# Patient Record
Sex: Male | Born: 1995
Health system: Southern US, Community
[De-identification: ages and names within clinical notes are randomized; demographics above are authoritative.]

## PROBLEM LIST (undated history)

## (undated) DIAGNOSIS — Z21 Asymptomatic human immunodeficiency virus [HIV] infection status: Secondary | ICD-10-CM

## (undated) DIAGNOSIS — A630 Anogenital (venereal) warts: Secondary | ICD-10-CM

## (undated) DIAGNOSIS — B20 Human immunodeficiency virus [HIV] disease: Secondary | ICD-10-CM

## (undated) DIAGNOSIS — D849 Immunodeficiency, unspecified: Secondary | ICD-10-CM

## (undated) HISTORY — PX: OTHER SURGICAL HISTORY: SHX169

---

## 2017-07-18 ENCOUNTER — Emergency Department (HOSPITAL_BASED_OUTPATIENT_CLINIC_OR_DEPARTMENT_OTHER)
Admission: EM | Admit: 2017-07-18 | Discharge: 2017-07-18 | Disposition: A | Discharge status: Home / Self Care | Payer: Self-pay | Attending: Physician Assistant | Admitting: Physician Assistant

## 2017-07-18 ENCOUNTER — Encounter (HOSPITAL_BASED_OUTPATIENT_CLINIC_OR_DEPARTMENT_OTHER): Payer: Self-pay | Admitting: Emergency Medicine

## 2017-07-18 DIAGNOSIS — B20 Human immunodeficiency virus [HIV] disease: Secondary | ICD-10-CM | POA: Insufficient documentation

## 2017-07-18 DIAGNOSIS — Z9889 Other specified postprocedural states: Secondary | ICD-10-CM | POA: Insufficient documentation

## 2017-07-18 DIAGNOSIS — K5903 Drug induced constipation: Secondary | ICD-10-CM | POA: Insufficient documentation

## 2017-07-18 DIAGNOSIS — F17200 Nicotine dependence, unspecified, uncomplicated: Secondary | ICD-10-CM | POA: Insufficient documentation

## 2017-07-18 HISTORY — DX: Anogenital (venereal) warts: A63.0

## 2017-07-18 HISTORY — DX: Human immunodeficiency virus (HIV) disease: B20

## 2017-07-18 HISTORY — DX: Asymptomatic human immunodeficiency virus (hiv) infection status: Z21

## 2017-07-18 HISTORY — DX: Immunodeficiency, unspecified: D84.9

## 2017-07-18 MED ORDER — LIDOCAINE HCL 2 % EX GEL
1.0000 "application " | Freq: Once | CUTANEOUS | Status: AC
  Administered 2017-07-18: 1
  Filled 2017-07-18: qty 20

## 2017-07-18 MED ORDER — CYCLOBENZAPRINE HCL 10 MG PO TABS: 10.0000 mg | ORAL_TABLET | Freq: Two times a day (BID) | ORAL | 0 refills | Status: AC | PRN

## 2017-07-18 MED ORDER — POLYETHYLENE GLYCOL 3350 17 G PO PACK: 17.0000 g | PACK | Freq: Every day | ORAL | 0 refills | Status: AC

## 2017-07-18 NOTE — ED Triage Notes (Signed)
Pt s/p genital wart surgery. States constipation, abd and back pain since. Also reports frank rectal bleeding. Denies dizziness or lightheadedness. Denies fever, nausea or vomiting.

## 2017-07-18 NOTE — ED Notes (Signed)
ED Provider at bedside. Exam completed with RN at bedside.

## 2017-07-18 NOTE — Discharge Instructions (Signed)
Please call your surgeon if you have any increase in bleeding, however your wound appears well healing today. Please use MiraLAX to help keep your stool softening. You can use Flexeril to help with your muscle back strain, it is a muscle relaxant, so it will make you sleepy so please do not drive or drink with it. Please return immediately if you have increased bleeding, or other concerns.

## 2017-07-18 NOTE — ED Provider Notes (Signed)
[facility] Provider [facility]   CSN: [id number] Arrival date & time: 07/18/17  0303     History   Chief Complaint Chief Complaint  Patient presents with  . Abdominal Pain    HPI Clement Audree Camel Traum is a 21 y.o. male.  HPI   Patient is a 21 year old male HIV positive with recent genital wart surgery to his rectum. This is done on Thursday at [facility]. Patient reports since surgery he has been having trouble stooling. He's been taking hydrocodone for the pain. Patient reports that he had some bleeding rectally with stooling. This filled up a 4 x 4. Patient also reports having back pain since the surgery. He talked to his surgeon about it who told him to use ibuprofen to help likely muscle strain. It is worse with movement. No pain at rest. It is located bilaterally in the lower back.  Past Medical History:  Diagnosis Date  . Genital warts   . HIV (human immunodeficiency virus infection) (HCC)   . Immune deficiency disorder (HCC)     There are no active problems to display for this patient.   Past Surgical History:  Procedure Laterality Date  . genital wart removal         Home Medications    Prior to Admission medications   Medication Sig Start Date End Date Taking? Authorizing Provider  docusate sodium (COLACE) 100 MG capsule Take 100 mg by mouth 2 (two) times daily.   Yes [provider]  HYDROcodone-acetaminophen (NORCO/VICODIN) 5-325 MG tablet Take 1 tablet by mouth every 6 (six) hours as needed for moderate pain.   Yes [provider]  cyclobenzaprine (FLEXERIL) 10 MG tablet Take 1 tablet (10 mg total) by mouth 2 (two) times daily as needed for muscle spasms. 07/18/17   Noralyn Karim Lyn, MD  polyethylene glycol (MIRALAX) packet Take 17 g by mouth daily. 07/18/17   Karlena Luebke, Cindee Saltourteney Lyn, MD    Family History No family history on file.  Social History Social History  Substance Use Topics  . Smoking status: Current Every Day  Smoker  . Smokeless tobacco: Never Used  . Alcohol use Yes     Comment: occasional     Allergies   Patient has no allergy information on record.   Review of Systems Review of Systems  Constitutional: Negative for activity change, fatigue and fever.  Respiratory: Negative for shortness of breath.   Cardiovascular: Negative for chest pain.  Gastrointestinal: Positive for anal bleeding. Negative for abdominal pain.  Neurological: Negative for headaches.     Physical Exam Updated Vital Signs BP 134/81 (BP Location: Right Arm)   Pulse 82   Temp 98.5 F (36.9 C) (Oral)   Resp 16   Ht 5\' 11"  (1.803 m)   Wt 63.5 kg (140 lb)   SpO2 100%   BMI 19.53 kg/m   Physical Exam  Constitutional: He is oriented to person, place, and time. He appears well-nourished.  HENT:  Head: Normocephalic.  Eyes: Conjunctivae are normal. Right eye exhibits no discharge. Left eye exhibits no discharge.  Cardiovascular: Normal rate and regular rhythm.   Pulmonary/Chest: Effort normal and breath sounds normal. No respiratory distress.  Genitourinary: Penis normal.  Genitourinary Comments: Rectum with no bleeding.  Musculoskeletal: Normal range of motion.  Neurological: He is oriented to person, place, and time.  Skin: Skin is warm and dry. He is not diaphoretic.  Psychiatric: He has a normal mood and affect. His behavior is normal.  ED Treatments / Results  Labs (all labs ordered are listed, but only abnormal results are displayed) Labs Reviewed - No data to display  EKG  EKG Interpretation None       Radiology No results found.  Procedures Procedures (including critical care time)  Medications Ordered in ED Medications  lidocaine (XYLOCAINE) 2 % jelly 1 application (not administered)     Initial Impression / Assessment and Plan / ED Course  I have reviewed the triage vital signs and the nursing notes.  Pertinent labs & imaging results that were available during my care of  the patient were reviewed by me and considered in my medical decision making (see chart for details).     Patient here after rectal surgery for removal genital warts. Patient's has no active bleeding and he describes very little bleeding.  Patient initially had abdominal pain on arrival. However he had a giant stool here and felt feels much better. We'll have him use MiraLAX in addition to colace to help keep stools soft. He has normal vitals and physical exam. Appears very relaxed and upbeat. Patient has back pain since the surgery, think it is likely musculoskeletal in nature. Will give muscle releaxants to help. Patient has no fever no other concerning symptoms. We'll have him follow-up with the surgeon next week.  Final Clinical Impressions(s) / ED Diagnoses   Final diagnoses:  Drug-induced constipation    New Prescriptions New Prescriptions   CYCLOBENZAPRINE (FLEXERIL) 10 MG TABLET    Take 1 tablet (10 mg total) by mouth 2 (two) times daily as needed for muscle spasms.   POLYETHYLENE GLYCOL (MIRALAX) PACKET    Take 17 g by mouth daily.     Abelino DerrickMackuen, Damarie Schoolfield Lyn, MD 07/18/17 [phone]

## 2019-08-14 ENCOUNTER — Other Ambulatory Visit: Payer: Self-pay

## 2019-08-14 ENCOUNTER — Emergency Department (HOSPITAL_BASED_OUTPATIENT_CLINIC_OR_DEPARTMENT_OTHER)
Admission: EM | Admit: 2019-08-14 | Discharge: 2019-08-14 | Disposition: A | Discharge status: Home / Self Care | Payer: BC Managed Care – PPO | Attending: Emergency Medicine | Admitting: Emergency Medicine

## 2019-08-14 ENCOUNTER — Encounter (HOSPITAL_BASED_OUTPATIENT_CLINIC_OR_DEPARTMENT_OTHER): Payer: Self-pay | Admitting: *Deleted

## 2019-08-14 ENCOUNTER — Emergency Department (HOSPITAL_BASED_OUTPATIENT_CLINIC_OR_DEPARTMENT_OTHER): Payer: BC Managed Care – PPO

## 2019-08-14 DIAGNOSIS — Z79899 Other long term (current) drug therapy: Secondary | ICD-10-CM | POA: Insufficient documentation

## 2019-08-14 DIAGNOSIS — M545 Low back pain, unspecified: Secondary | ICD-10-CM

## 2019-08-14 DIAGNOSIS — R519 Headache, unspecified: Secondary | ICD-10-CM

## 2019-08-14 DIAGNOSIS — R51 Headache: Secondary | ICD-10-CM | POA: Insufficient documentation

## 2019-08-14 DIAGNOSIS — F17200 Nicotine dependence, unspecified, uncomplicated: Secondary | ICD-10-CM | POA: Diagnosis not present

## 2019-08-14 DIAGNOSIS — Z21 Asymptomatic human immunodeficiency virus [HIV] infection status: Secondary | ICD-10-CM | POA: Insufficient documentation

## 2019-08-14 LAB — URINALYSIS, ROUTINE W REFLEX MICROSCOPIC
Bilirubin Urine: NEGATIVE
Glucose, UA: NEGATIVE mg/dL
Hgb urine dipstick: NEGATIVE
Ketones, ur: NEGATIVE mg/dL
Leukocytes,Ua: NEGATIVE
Nitrite: NEGATIVE
Protein, ur: NEGATIVE mg/dL
Specific Gravity, Urine: 1.025 (ref 1.005–1.030)
pH: 6 (ref 5.0–8.0)

## 2019-08-14 MED ORDER — METHOCARBAMOL 500 MG PO TABS: 500.0000 mg | ORAL_TABLET | Freq: Two times a day (BID) | ORAL | 0 refills | Status: AC

## 2019-08-14 MED ORDER — KETOROLAC TROMETHAMINE 60 MG/2ML IM SOLN
60.0000 mg | Freq: Once | INTRAMUSCULAR | Status: AC
  Administered 2019-08-14: 60 mg via INTRAMUSCULAR
  Filled 2019-08-14: qty 2

## 2019-08-14 MED FILL — METHOCARBAMOL 500 MG TABS: 500 | 10 days supply | Qty: 20 | Fill #0

## 2019-08-14 NOTE — Discharge Instructions (Signed)
You can take Tylenol or Ibuprofen as directed for pain. You can alternate Tylenol and Ibuprofen every 4 hours. If you take Tylenol at 1pm, then you can take Ibuprofen at 5pm. Then you can take Tylenol again at 9pm.   Take Robaxin as prescribed. This medication will make you drowsy so do not drive or drink alcohol when taking it.  Follow-up with your primary care doctor.  Return the emergency department any fever, vision changes, nausea/vomiting, numbness/weakness, worsening pain or any other worsening concerning symptoms.

## 2019-08-14 NOTE — ED Provider Notes (Signed)
[facility] EMERGENCY DEPARTMENT Provider Note   CSN: [id number] Arrival date & time: 08/14/19  1053     History   Chief Complaint Chief Complaint  Patient presents with   Back Pain    HPI Ernest Green is a 23 y.o. male past medical history of HIV (last T-helper count of 0.71 in Feb 2020) who presents for evaluation of headache and lower back pain that began yesterday.  He is denies any preceding trauma, injury prior to onset of symptoms.  He states headache started off mild and progressively worsened.  He took Tylenol yesterday and states that it causes headache to go away.  He states that it returned.  He describes it as a 5/10.  No associated blurry vision, nausea/vomiting, numbness/weakness of his arms or legs.  He states that he also has some lower back pain that is worse with movement.  He states that he has not had any abdominal pain, dysuria, hematuria, nausea/vomiting.  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  He has been able to ambulate without any difficulty. Denies fevers, weight loss, numbness/weakness of upper and lower extremities, bowel/bladder incontinence, saddle anesthesia, history of back surgery, history of IVDA. Patient denies any CP, SOB.      The history is provided by the patient.    Past Medical History:  Diagnosis Date   Genital warts    HIV (human immunodeficiency virus infection) (HCC)    Immune deficiency disorder (HCC)     There are no active problems to display for this patient.   Past Surgical History:  Procedure Laterality Date   genital wart removal          Home Medications    Prior to Admission medications   Medication Sig Start Date End Date Taking? Authorizing Provider  Elviteg-Cobic-Emtricit-TenofAF (GENVOYA PO) Take by mouth.   Yes [provider]  cyclobenzaprine (FLEXERIL) 10 MG tablet Take 1 tablet (10 mg total) by mouth 2 (two) times daily as needed for muscle spasms. 07/18/17   Mackuen,  Courteney Lyn, MD  docusate sodium (COLACE) 100 MG capsule Take 100 mg by mouth 2 (two) times daily.    [provider]  HYDROcodone-acetaminophen (NORCO/VICODIN) 5-325 MG tablet Take 1 tablet by mouth every 6 (six) hours as needed for moderate pain.    [provider]  methocarbamol (ROBAXIN) 500 MG tablet Take 1 tablet (500 mg total) by mouth 2 (two) times daily. 08/14/19   Maxwell CaulLayden, Shelisha Gautier A, PA-C  polyethylene glycol (MIRALAX) packet Take 17 g by mouth daily. 07/18/17   Mackuen, Cindee Saltourteney Lyn, MD    Family History No family history on file.  Social History Social History   Tobacco Use   Smoking status: Current Every Day Smoker   Smokeless tobacco: Never Used  Substance Use Topics   Alcohol use: Yes    Comment: occasional   Drug use: Yes    Types: Marijuana     Allergies   Patient has no known allergies.   Review of Systems Review of Systems  Constitutional: Negative for fever.  Eyes: Negative for visual disturbance.  Respiratory: Negative for shortness of breath.   Cardiovascular: Negative for chest pain.  Gastrointestinal: Negative for nausea and vomiting.  Musculoskeletal: Positive for back pain. Negative for neck pain.  Neurological: Positive for headaches. Negative for weakness and numbness.  All other systems reviewed and are negative.    Physical Exam Updated Vital Signs BP 129/85 (BP Location: Right Arm)  Pulse (!) 54    Temp 98.5 F (36.9 C) (Oral)    Resp 14    Ht 5\' 11"  (1.803 m)    Wt 63.5 kg    SpO2 100%    BMI 19.53 kg/m   Physical Exam Vitals signs and nursing note reviewed.  Constitutional:      Appearance: Normal appearance. He is well-developed.  HENT:     Head: Normocephalic and atraumatic.  Eyes:     General: Lids are normal.     Conjunctiva/sclera: Conjunctivae normal.     Pupils: Pupils are equal, round, and reactive to light.     Comments: PERRL. EOMs intact. No nystagmus. No neglect.   Neck:      Musculoskeletal: Full passive range of motion without pain.     Comments: Full flexion/extension and lateral movement of neck fully intact. No bony midline tenderness. No deformities or crepitus.  Neck is supple and without rigidity.  Negative Brudzinski, Kernig sign. Cardiovascular:     Rate and Rhythm: Normal rate and regular rhythm.     Pulses: Normal pulses.     Heart sounds: Normal heart sounds. No murmur. No friction rub. No gallop.   Pulmonary:     Effort: Pulmonary effort is normal.     Breath sounds: Normal breath sounds.     Comments: Lungs clear to auscultation bilaterally.  Symmetric chest rise.  No wheezing, rales, rhonchi. Abdominal:     Palpations: Abdomen is soft. Abdomen is not rigid.     Tenderness: There is no abdominal tenderness. There is no guarding.     Comments: Abdomen is soft, non-distended, non-tender. No rigidity, No guarding. No peritoneal signs.  No CVA tenderness noted bilaterally.  Musculoskeletal: Normal range of motion.     Thoracic back: He exhibits no tenderness.       Back:     Comments: No midline T spine tenderness. Diffuse lumbar tenderness noted to the lower back that extends over across midline.  No deformity or crepitus noted.  Skin:    General: Skin is warm and dry.     Capillary Refill: Capillary refill takes less than 2 seconds.  Neurological:     Mental Status: He is alert and oriented to person, place, and time.     Comments: Cranial nerves III-XII intact Follows commands, Moves all extremities  5/5 strength to BUE and BLE  Sensation intact throughout all major nerve distributions Normal coordination No slurred speech. No facial droop.   Psychiatric:        Speech: Speech normal.      ED Treatments / Results  Labs (all labs ordered are listed, but only abnormal results are displayed) Labs Reviewed  URINALYSIS, ROUTINE W REFLEX MICROSCOPIC    EKG None  Radiology Dg Thoracic Spine 2 View  Result Date: 08/14/2019 CLINICAL  DATA:  Dorsalgia EXAM: THORACIC SPINE 3 VIEWS COMPARISON:  None. FINDINGS: Frontal, lateral, and swimmer's views were obtained. No fracture or spondylolisthesis. Disc spaces appear normal. No erosive change or paraspinous lesion. Visualized lungs clear. IMPRESSION: No fracture or spondylolisthesis.  No evident arthropathy. Electronically Signed   By: Bretta BangWilliam  Woodruff III M.D.   On: 08/14/2019 13:55   Dg Lumbar Spine Complete  Result Date: 08/14/2019 CLINICAL DATA:  Acute onset lumbago EXAM: LUMBAR SPINE - COMPLETE 4+ VIEW COMPARISON:  None. FINDINGS: Frontal, lateral, spot lumbosacral lateral, and bilateral oblique views were obtained. There are 5 non-rib-bearing lumbar type vertebral bodies. There is no fracture or spondylolisthesis. The disc spaces  appear unremarkable. There is no appreciable facet arthropathy. IMPRESSION: No fracture or spondylolisthesis.  No evident arthropathy. Electronically Signed   By: Lowella Grip III M.D.   On: 08/14/2019 13:55    Procedures Procedures (including critical care time)  Medications Ordered in ED Medications  ketorolac (TORADOL) injection 60 mg (60 mg Intramuscular Given 08/14/19 1325)     Initial Impression / Assessment and Plan / ED Course  I have reviewed the triage vital signs and the nursing notes.  Pertinent labs & imaging results that were available during my care of the patient were reviewed by me and considered in my medical decision making (see chart for details).        23 y.o. M with PMH/o HIV who presents lower back pain and headache that began yesterday.  Took Tylenol which improved symptoms but returned, prompting ED visit.  No associated fevers, numbness/weakness, nausea/vomiting.  He does have history of HIV but denies any history of IV drug use or any other red flags.  He is afebrile here in the department.  He is sitting company on bed with no signs of acute distress.  Vital signs are stable.  On exam, he has no neuro deficits.   No abdominal tenderness.  He has diffuse tenderness noted over the lower back.  History/physical exam not concerning for cauda equina, spinal abscess, meningitis.  Additionally, doubt intracranial hemorrhage, CVA, intracranial mass, dural venous thrombosis.  Doubt GU etiology.  Suspect this may be musculoskeletal back pain.  Plan for imaging, analgesics.  Urine negative for any infectious etiology.  X-ray of lumbar and T-spine shows no evidence of acute bony abnormality.  Reevaluation.  Patient reports headache is down to 2 after analgesics and back pain is completely gone.  At this time, do not feel patient needs any further imaging of head or back.  I did offer patient IV medications at home with headache but patient would like to go home and do Tylenol ibuprofen.  I feel that is reasonable.  I discussed with patient regarding strict return precautions. Discussed patient with Dr. Regenia Skeeter who is agreeable to plan. At this time, patient exhibits no emergent life-threatening condition that require further evaluation in ED or admission. Patient had ample opportunity for questions and discussion. All patient's questions were answered with full understanding. Strict return precautions discussed. Patient expresses understanding and agreement to plan.   Portions of this note were generated with [employer]. Dictation errors may occur despite best attempts at proofreading.   Final Clinical Impressions(s) / ED Diagnoses   Final diagnoses:  Acute bilateral low back pain, unspecified whether sciatica present  Generalized headache    ED Discharge Orders         Ordered    methocarbamol (ROBAXIN) 500 MG tablet  2 times daily     08/14/19 1525           Desma Mcgregor 08/14/19 2146    Sherwood Gambler, MD 08/15/19 [phone]

## 2019-08-14 NOTE — ED Notes (Signed)
Pt. Walked to radiology with no trouble.

## 2019-08-14 NOTE — ED Notes (Signed)
Pt. Reports pain started last night when he woke at 3am.  Pt. Reports pain in his low back pointing at the lumbar region and to the mid back to each side of the spine.

## 2019-08-14 NOTE — ED Notes (Signed)
UA at bedside

## 2019-08-14 NOTE — ED Triage Notes (Signed)
Fatigue, headache and abdominal pain.

## 2020-02-19 IMAGING — DX LUMBAR SPINE - COMPLETE 4+ VIEW
5 series · 5 of 5 positions shown · non-contrast
Comparison: None.

CLINICAL DATA: Acute onset lumbago

EXAM:
LUMBAR SPINE - COMPLETE 4+ VIEW

[l-spine ap]
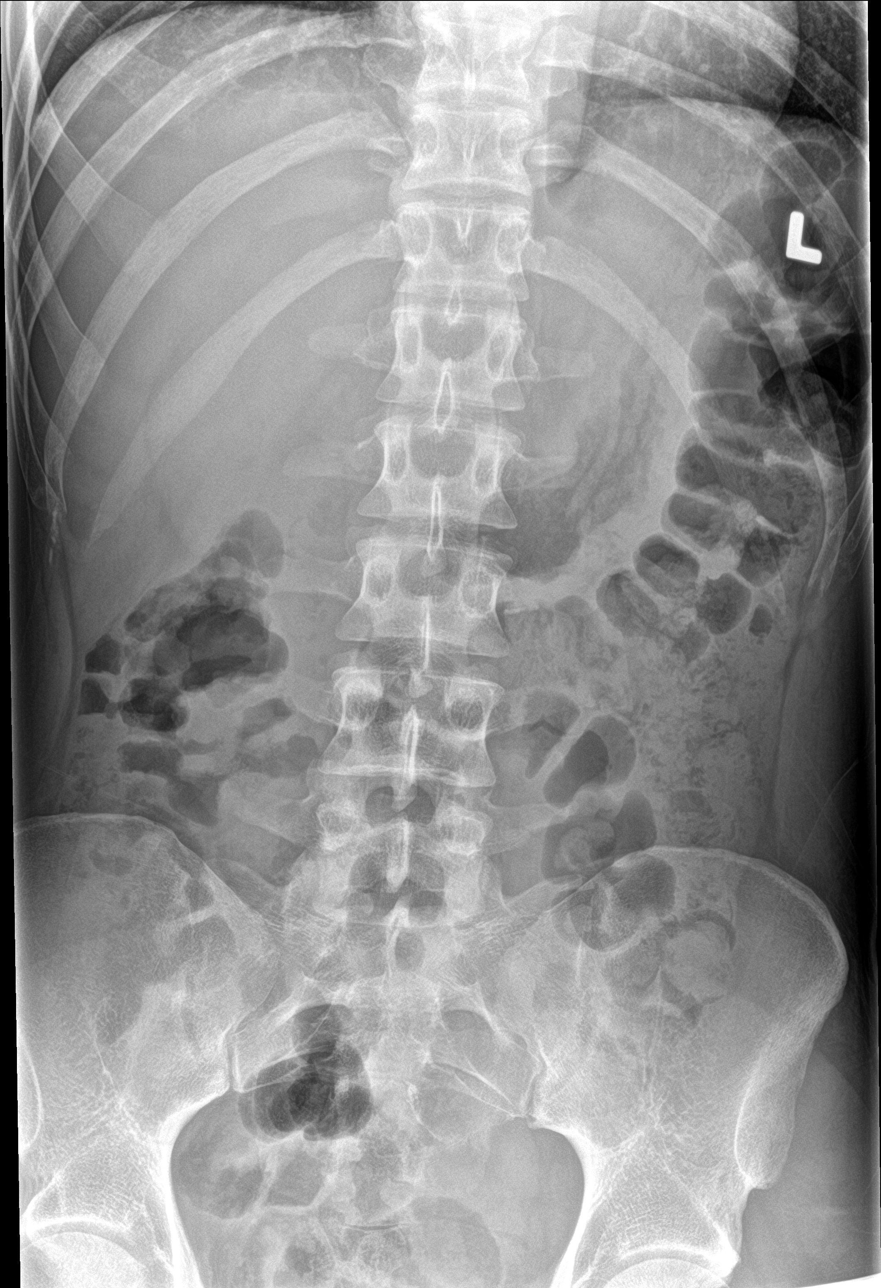

[l-spine obl (1 of 2)]
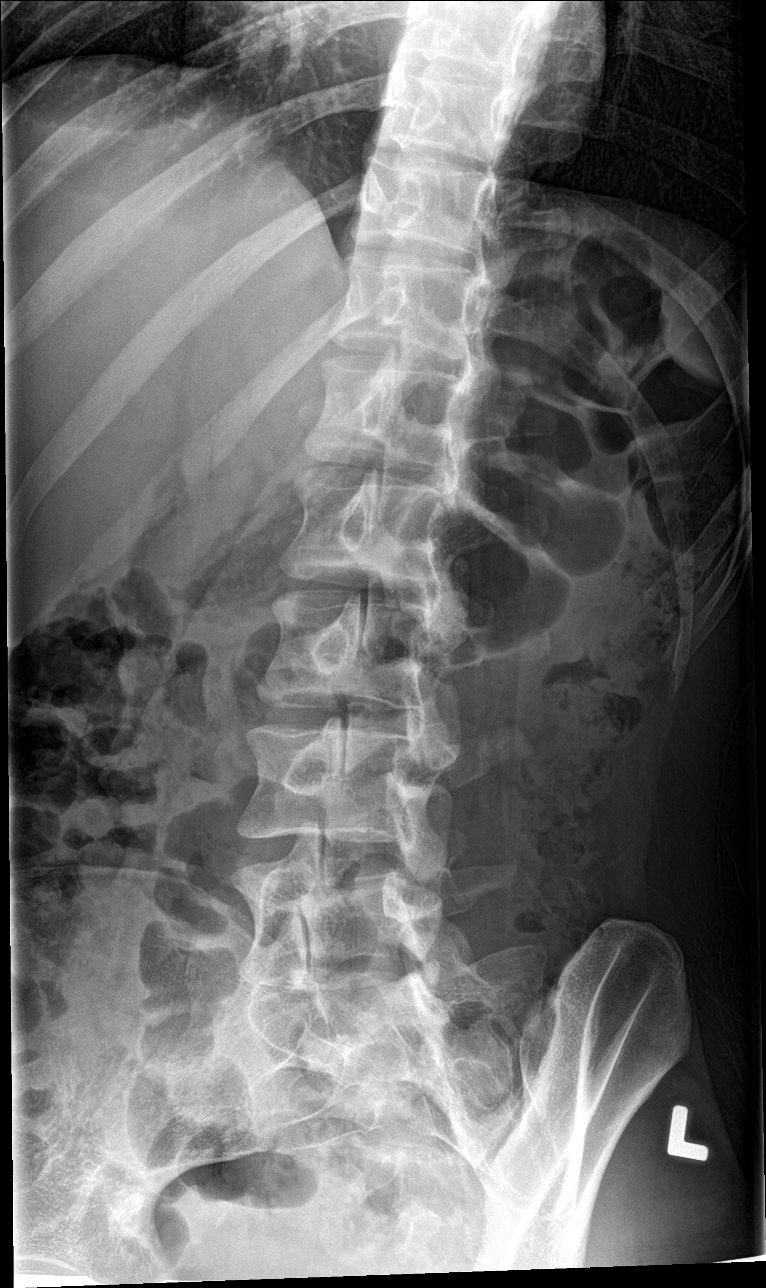

[l-spine lat]
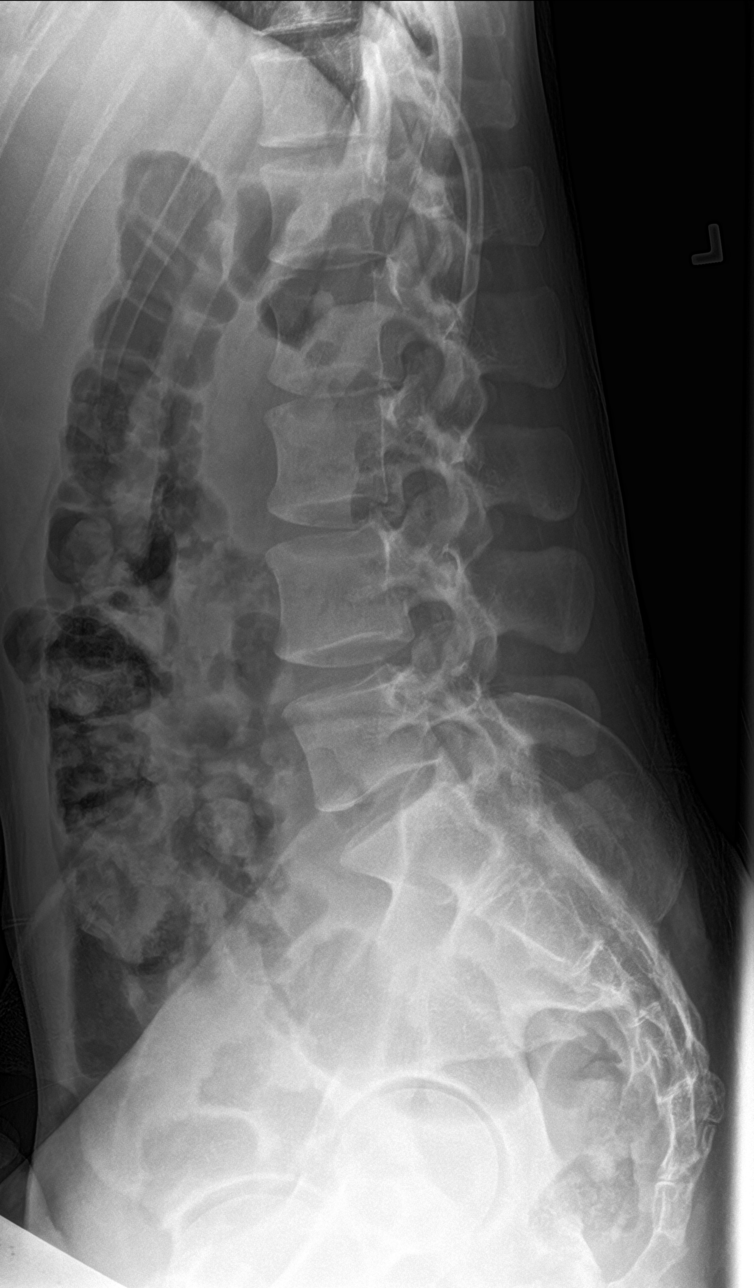

[l-spine spot]
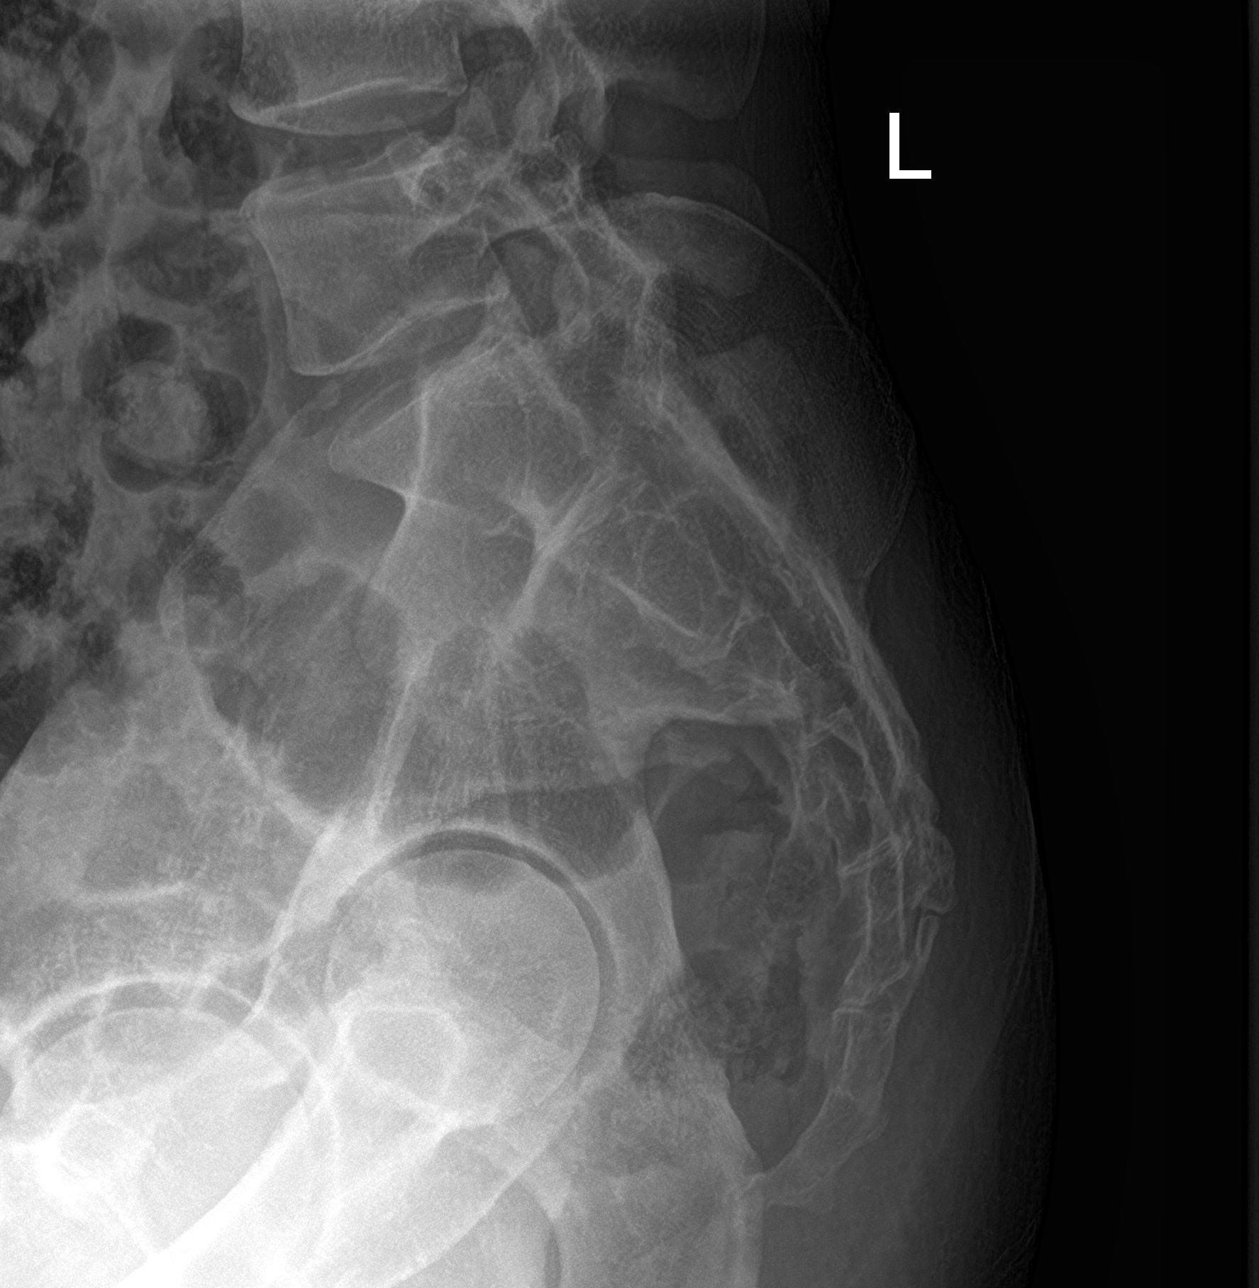

[l-spine obl (2 of 2)]
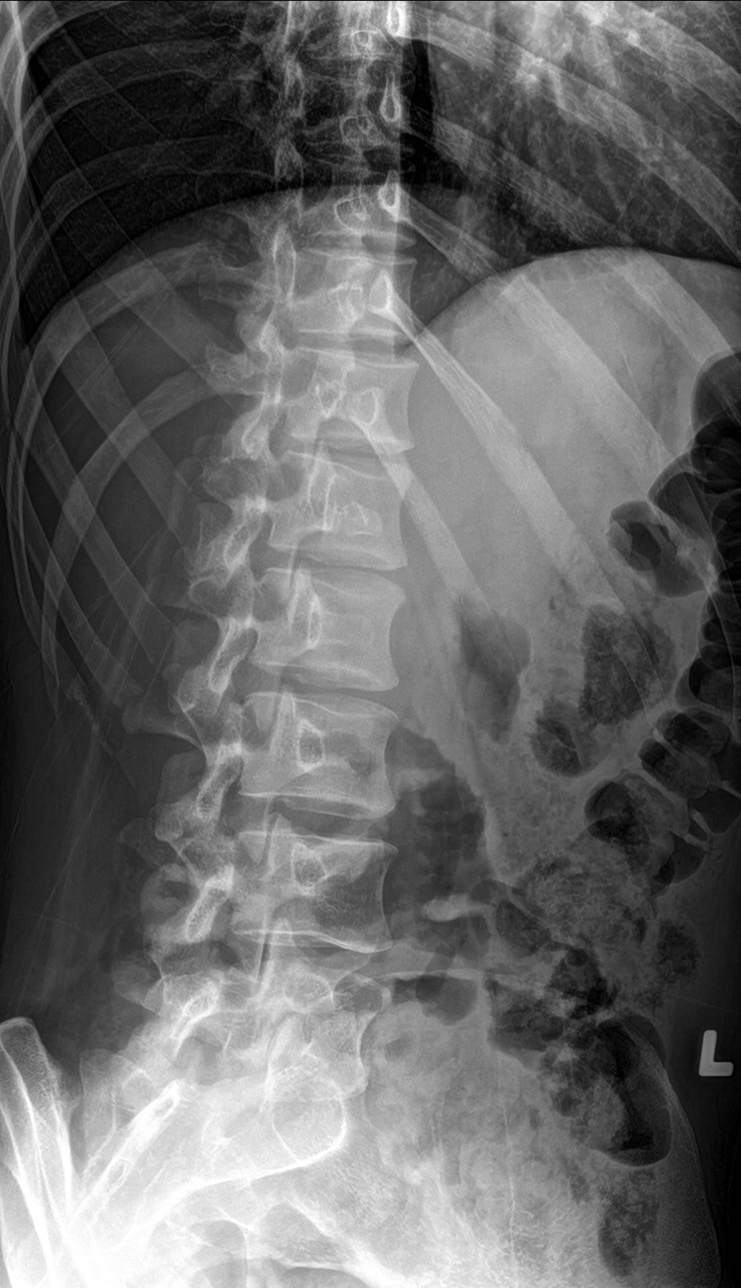

[5 of 5 positions shown; findings below may reference images not displayed]

FINDINGS: Frontal, lateral, spot lumbosacral lateral, and bilateral oblique
views were obtained. There are 5 non-rib-bearing lumbar type
vertebral bodies. There is no fracture or spondylolisthesis. The
disc spaces appear unremarkable. There is no appreciable facet
arthropathy.
IMPRESSION: No fracture or spondylolisthesis.  No evident arthropathy.
